# Patient Record
Sex: Female | Born: 1935 | Race: White | Hispanic: No | Marital: Married | Smoking: Never smoker
Health system: Southern US, Community
[De-identification: ages and names within clinical notes are randomized; demographics above are authoritative.]

## PROBLEM LIST (undated history)

## (undated) DIAGNOSIS — Z90710 Acquired absence of both cervix and uterus: Secondary | ICD-10-CM

## (undated) DIAGNOSIS — H269 Unspecified cataract: Secondary | ICD-10-CM

## (undated) DIAGNOSIS — G309 Alzheimer's disease, unspecified: Secondary | ICD-10-CM

## (undated) DIAGNOSIS — C801 Malignant (primary) neoplasm, unspecified: Secondary | ICD-10-CM

## (undated) DIAGNOSIS — M8580 Other specified disorders of bone density and structure, unspecified site: Secondary | ICD-10-CM

## (undated) DIAGNOSIS — L900 Lichen sclerosus et atrophicus: Secondary | ICD-10-CM

## (undated) DIAGNOSIS — Z9079 Acquired absence of other genital organ(s): Secondary | ICD-10-CM

## (undated) DIAGNOSIS — Z90722 Acquired absence of ovaries, bilateral: Secondary | ICD-10-CM

## (undated) DIAGNOSIS — M810 Age-related osteoporosis without current pathological fracture: Secondary | ICD-10-CM

## (undated) DIAGNOSIS — F0280 Dementia in other diseases classified elsewhere without behavioral disturbance: Secondary | ICD-10-CM

## (undated) HISTORY — DX: Unspecified cataract: H26.9

## (undated) HISTORY — DX: Other specified disorders of bone density and structure, unspecified site: M85.80

## (undated) HISTORY — DX: Acquired absence of ovaries, bilateral: Z90.722

## (undated) HISTORY — PX: SMALL INTESTINE SURGERY: SHX150

## (undated) HISTORY — DX: Lichen sclerosus et atrophicus: L90.0

## (undated) HISTORY — DX: Malignant (primary) neoplasm, unspecified: C80.1

## (undated) HISTORY — PX: EYE SURGERY: SHX253

## (undated) HISTORY — PX: HIP SURGERY: SHX245

## (undated) HISTORY — DX: Age-related osteoporosis without current pathological fracture: M81.0

## (undated) HISTORY — DX: Acquired absence of both cervix and uterus: Z90.710

## (undated) HISTORY — PX: ABDOMINAL HYSTERECTOMY: SHX81

## (undated) HISTORY — DX: Acquired absence of other genital organ(s): Z90.79

---

## 1997-11-19 ENCOUNTER — Other Ambulatory Visit: Admission: RE | Admit: 1997-11-19 | Discharge: 1997-11-19 | Payer: Self-pay | Admitting: Obstetrics and Gynecology

## 1999-07-01 ENCOUNTER — Other Ambulatory Visit: Admission: RE | Admit: 1999-07-01 | Discharge: 1999-07-01 | Payer: Self-pay | Admitting: Obstetrics and Gynecology

## 2000-05-25 ENCOUNTER — Encounter: Payer: Self-pay | Admitting: Specialist

## 2000-05-25 ENCOUNTER — Encounter: Admission: RE | Admit: 2000-05-25 | Discharge: 2000-05-25 | Payer: Self-pay | Admitting: Specialist

## 2000-06-15 ENCOUNTER — Encounter: Admission: RE | Admit: 2000-06-15 | Discharge: 2000-06-15 | Payer: Self-pay | Admitting: Orthopedic Surgery

## 2000-06-15 ENCOUNTER — Encounter: Payer: Self-pay | Admitting: Orthopedic Surgery

## 2000-07-16 ENCOUNTER — Other Ambulatory Visit: Admission: RE | Admit: 2000-07-16 | Discharge: 2000-07-16 | Payer: Self-pay | Admitting: Obstetrics and Gynecology

## 2001-07-20 ENCOUNTER — Other Ambulatory Visit: Admission: RE | Admit: 2001-07-20 | Discharge: 2001-07-20 | Payer: Self-pay | Admitting: Obstetrics and Gynecology

## 2002-08-01 ENCOUNTER — Other Ambulatory Visit: Admission: RE | Admit: 2002-08-01 | Discharge: 2002-08-01 | Payer: Self-pay | Admitting: Obstetrics and Gynecology

## 2004-08-06 ENCOUNTER — Other Ambulatory Visit: Admission: RE | Admit: 2004-08-06 | Discharge: 2004-08-06 | Payer: Self-pay | Admitting: Obstetrics and Gynecology

## 2005-09-17 ENCOUNTER — Other Ambulatory Visit: Admission: RE | Admit: 2005-09-17 | Discharge: 2005-09-17 | Payer: Self-pay | Admitting: Obstetrics and Gynecology

## 2007-02-21 ENCOUNTER — Encounter: Admission: RE | Admit: 2007-02-21 | Discharge: 2007-02-21 | Payer: Self-pay | Admitting: Obstetrics and Gynecology

## 2012-02-03 ENCOUNTER — Encounter: Payer: Self-pay | Admitting: Obstetrics and Gynecology

## 2012-02-03 ENCOUNTER — Ambulatory Visit (INDEPENDENT_AMBULATORY_CARE_PROVIDER_SITE_OTHER): Payer: Medicare Other | Admitting: Obstetrics and Gynecology

## 2012-02-03 VITALS — BP 130/62 | Ht 65.0 in | Wt 112.0 lb

## 2012-02-03 DIAGNOSIS — M8580 Other specified disorders of bone density and structure, unspecified site: Secondary | ICD-10-CM | POA: Insufficient documentation

## 2012-02-03 DIAGNOSIS — M810 Age-related osteoporosis without current pathological fracture: Secondary | ICD-10-CM | POA: Insufficient documentation

## 2012-02-03 DIAGNOSIS — Z9079 Acquired absence of other genital organ(s): Secondary | ICD-10-CM

## 2012-02-03 DIAGNOSIS — Z90710 Acquired absence of both cervix and uterus: Secondary | ICD-10-CM

## 2012-02-03 DIAGNOSIS — N952 Postmenopausal atrophic vaginitis: Secondary | ICD-10-CM

## 2012-02-03 DIAGNOSIS — C549 Malignant neoplasm of corpus uteri, unspecified: Secondary | ICD-10-CM

## 2012-02-03 DIAGNOSIS — C541 Malignant neoplasm of endometrium: Secondary | ICD-10-CM | POA: Insufficient documentation

## 2012-02-03 DIAGNOSIS — R238 Other skin changes: Secondary | ICD-10-CM

## 2012-02-03 DIAGNOSIS — M899 Disorder of bone, unspecified: Secondary | ICD-10-CM

## 2012-02-03 DIAGNOSIS — Z719 Counseling, unspecified: Secondary | ICD-10-CM

## 2012-02-03 DIAGNOSIS — L988 Other specified disorders of the skin and subcutaneous tissue: Secondary | ICD-10-CM

## 2012-02-03 MED ORDER — CLOBETASOL PROPIONATE 0.05 % EX OINT: TOPICAL_OINTMENT | CUTANEOUS | Status: DC

## 2012-02-03 MED ORDER — ESTROGENS, CONJUGATED 0.625 MG/GM VA CREA: TOPICAL_CREAM | Freq: Every day | VAGINAL | Status: DC

## 2012-02-03 NOTE — Progress Notes (Signed)
The patient is not taking hormone replacement therapy The patient  is taking a Calcium supplement. Post-menopausal bleeding:no  Last Pap: 2012 UNSATISFACTORY FOR EVALUATION Last mammogram: approximate date 02/02/2011 and was normal  Last DEXA scan : 01/2011 Last colonoscopy:approximate date 2012 and was normal  Urinary symptoms: states that urine is much darker than usual  Normal bowel movements: Yes Reports abuse at home: No:  Subjective:    Adriana Gray is a 78 y.o. female G2P2 who presents for annual exam.  The patient has no complaints today. The patient was unable to complete her bone density test because the facility stated it had not been 2 years.  Our records show the last test was done 02/2010.  The following portions of the patient's history were reviewed and updated as appropriate: allergies, current medications, past family history, past medical history, past social history, past surgical history and problem list.  Review of Systems Pertinent items are noted in HPI. Gastrointestinal:No change in bowel habits, no abdominal pain, no rectal bleeding Genitourinary:negative for dysuria, frequency, hematuria, nocturia and urinary incontinence    Objective:     BP 130/62  Ht 5\' 5"  (1.651 m)  Wt 112 lb (50.803 kg)  BMI 18.64 kg/m2  Weight:  Wt Readings from Last 1 Encounters:  02/03/12 112 lb (50.803 kg)     BMI: Body mass index is 18.64 kg/(m^2). General Appearance: Alert, appropriate appearance for age. No acute distress.  Right hand with several bullous lesions, some with opening of the lesions.  No bleeding. No induration, some with scaling. HEENT: Grossly normal Neck / Thyroid: Supple, no masses, nodes or enlargement Lungs: clear to auscultation bilaterally Back: No CVA tenderness Breast Exam: No masses or nodes.No dimpling, nipple retraction or discharge. Cardiovascular: Regular rate and rhythm. S1, S2, no murmur Gastrointestinal: Soft, non-tender, no masses or  organomegaly Pelvic Exam: External genitalia: atrophic with narrowing of the introitus Vaginal: atrophic mucosa, discharge, yellow and vaginal vault, well suspended Rectovaginal: normal rectal, no masses Lymphatic Exam: Non-palpable nodes in neck, clavicular, axillary, or inguinal regions Skin: no rash or abnormalities Neurologic: Normal gait and speech, no tremor  Psychiatric: Alert and oriented, appropriate affect.    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   New dermatologic lesions of right hand  Lichen sclerosus with some introital narrowing Plan:   Temovate once weekly Premarin vaginal cream 0.5 mg once weekly Pap smear in 3 months F/U DXA in 7/13 Keep appt for Dermatology followup

## 2012-02-10 ENCOUNTER — Telehealth: Payer: Self-pay | Admitting: Obstetrics and Gynecology

## 2012-02-10 NOTE — Telephone Encounter (Signed)
CHANDRA/epic °

## 2012-02-10 NOTE — Telephone Encounter (Signed)
Lm on vm for pt to cb per telephone call. 

## 2012-02-11 MED ORDER — ESTROGENS, CONJUGATED 0.625 MG/GM VA CREA: TOPICAL_CREAM | VAGINAL | Status: DC

## 2012-02-11 NOTE — Telephone Encounter (Signed)
On 02/10/12-Tc from pt. Wants to know if there is any alternative to Premarin cream due to inc cost. Spoke with vph, Estradiol is available. Pt prefers to stay with Premarin recs, therefore pt given information on different pharmacies to call to check on cost. Pt will call back if one is found and will call in new rx for Premarin to that pharm. Pt agrees.

## 2012-02-11 NOTE — Telephone Encounter (Signed)
On 02/11/12- Tc from pt. Pt has found a pharm([facility]) who has a lower cost available for Premarin cream. Rx called into pharm as requested. Pt voices understanding.

## 2012-02-29 ENCOUNTER — Telehealth: Payer: Self-pay | Admitting: Obstetrics and Gynecology

## 2012-02-29 NOTE — Telephone Encounter (Signed)
Triage/epic 

## 2012-02-29 NOTE — Telephone Encounter (Signed)
vph pt 

## 2012-03-01 NOTE — Telephone Encounter (Signed)
Tc to pt per telephone call. Pt wants vph to know that she had her DEXA scan and want to know her recs rgdg POC. Will consult with vph and cb with recs. Results of DEXA are in epic system. Pt voices understanding.

## 2012-03-28 ENCOUNTER — Encounter: Payer: Self-pay | Admitting: Obstetrics and Gynecology

## 2014-07-02 ENCOUNTER — Encounter: Payer: Self-pay | Admitting: Obstetrics and Gynecology

## 2014-11-06 ENCOUNTER — Ambulatory Visit (INDEPENDENT_AMBULATORY_CARE_PROVIDER_SITE_OTHER): Payer: Medicare Other | Admitting: Family Medicine

## 2014-11-06 VITALS — BP 150/88 | HR 73 | Temp 98.2°F | Resp 16 | Ht 62.5 in | Wt 121.0 lb

## 2014-11-06 DIAGNOSIS — I493 Ventricular premature depolarization: Secondary | ICD-10-CM

## 2014-11-06 DIAGNOSIS — Z8542 Personal history of malignant neoplasm of other parts of uterus: Secondary | ICD-10-CM | POA: Diagnosis not present

## 2014-11-06 DIAGNOSIS — F0390 Unspecified dementia without behavioral disturbance: Secondary | ICD-10-CM | POA: Diagnosis not present

## 2014-11-06 NOTE — Patient Instructions (Signed)
Take the forms to your adult care living facility  Return as needed

## 2014-11-06 NOTE — Progress Notes (Signed)
Subjective: Patient is here for the first time. She has been living in [place] and is currently staying up here with her daughter waiting entry into a assisted living facility with memory care. She does quite well. She is on digoxin for heart, and I'm uncertain exactly what the diagnosis was cause that. She has a history of osteoporosis. She generally does quite well. Review of systems is really unremarkable. Has had endometrial cancer in the past many years ago. She has had cataract surgery, one of which did well, the other did not. She's had a hysterectomy, small bowel blockage, and left femur fracture. She does not smoke, drink, or use drugs. She is not known to be allergic to any medications. She just wanted a checkup in the form filled out today. No major other complaints.  She has been independent and functional. She takes care of her own personal needs. She is still driving until fairly recently. She has been evaluated by a neurology clinic to [facility] and told that she had mild dementia, undiagnosed type  Objective: 81 year old lady alert. She is talkative and answers questions well significantly confused. She is not oriented to place or time. Her isn't memory seems good. She is a widow. Throat clear. TMs normal. Neck supple. No carotid bruits. Chest clear. Heart regular but has a moderate number of ectopy. Abdomen soft without mass or tenderness. No ankle edema. They denied urinary incontinence which she does have a little urine odor.  Assessment: Mild dementia (may be moderate at this point) History of endometrial cancer History of heart disease, undetermined type, with fairly frequent ectopy. History of small bowel obstruction remotely History of hip fracture History of cataract surgeries  Plan: She should do well in assisted living with memory care. Form completed. Return when necessary.

## 2014-11-07 ENCOUNTER — Telehealth: Payer: Self-pay

## 2014-11-07 NOTE — Telephone Encounter (Signed)
If the call is not returned on 11/07/14 call and ask for Mel Almond [phone]

## 2014-11-07 NOTE — Telephone Encounter (Signed)
Adriana Gray from [facility] is needing to talk with someone about the form dr hopper completed  Yesterday a few things needs to be changed  Best number [phone]

## 2014-11-08 NOTE — Telephone Encounter (Signed)
FL-2  section 11 assisted living Section 18 0.125 qd, identify if it is a tablet or capsule

## 2014-11-08 NOTE — Telephone Encounter (Signed)
Left message for pt to call back  °

## 2014-11-09 NOTE — Telephone Encounter (Signed)
Called and got fax # to send back to [facility] and gave to [place] to fax.

## 2014-11-12 ENCOUNTER — Telehealth: Payer: Self-pay

## 2014-11-12 NOTE — Telephone Encounter (Signed)
Johanna from [facility] assisted living called to check on the status of some forms we were supposed to fax to them. From previous phone messages it looks like mary sent this to them on 11/09/14, however [facility] said they never received anything from [place].  Please resend Fax #[phone]

## 2014-11-13 NOTE — Telephone Encounter (Signed)
I resent the form.

## 2015-01-17 ENCOUNTER — Telehealth: Payer: Self-pay | Admitting: Interventional Cardiology

## 2015-01-17 NOTE — Telephone Encounter (Signed)
Records rec from [facility] have to chart prep

## 2015-01-31 ENCOUNTER — Ambulatory Visit: Payer: Self-pay | Admitting: Interventional Cardiology

## 2015-02-07 ENCOUNTER — Encounter: Payer: Self-pay | Admitting: *Deleted

## 2015-02-10 DIAGNOSIS — I491 Atrial premature depolarization: Secondary | ICD-10-CM | POA: Insufficient documentation

## 2015-02-11 ENCOUNTER — Ambulatory Visit (INDEPENDENT_AMBULATORY_CARE_PROVIDER_SITE_OTHER): Payer: Medicare Other | Admitting: Interventional Cardiology

## 2015-02-11 ENCOUNTER — Encounter: Payer: Self-pay | Admitting: Interventional Cardiology

## 2015-02-11 VITALS — BP 120/68 | HR 67 | Ht 62.5 in | Wt 115.2 lb

## 2015-02-11 DIAGNOSIS — I491 Atrial premature depolarization: Secondary | ICD-10-CM | POA: Diagnosis not present

## 2015-02-11 DIAGNOSIS — R4189 Other symptoms and signs involving cognitive functions and awareness: Secondary | ICD-10-CM | POA: Diagnosis not present

## 2015-02-11 DIAGNOSIS — I10 Essential (primary) hypertension: Secondary | ICD-10-CM | POA: Diagnosis not present

## 2015-02-11 DIAGNOSIS — C641 Malignant neoplasm of right kidney, except renal pelvis: Secondary | ICD-10-CM | POA: Diagnosis not present

## 2015-02-11 DIAGNOSIS — C649 Malignant neoplasm of unspecified kidney, except renal pelvis: Secondary | ICD-10-CM | POA: Insufficient documentation

## 2015-02-11 MED ORDER — DIGOXIN 125 MCG PO TABS: 125.0000 ug | ORAL_TABLET | Freq: Every day | ORAL | Status: DC

## 2015-02-11 MED ORDER — DIGOXIN 125 MCG PO TABS: 125.0000 ug | ORAL_TABLET | Freq: Every day | ORAL | Status: AC

## 2015-02-11 NOTE — Patient Instructions (Signed)
Medication Instructions:  Your physician recommends that you continue on your current medications as directed. Please refer to the Current Medication list given to you today.   Labwork: NONE  Testing/Procedures: NONE  Follow-Up: Your physician wants you to follow-up in: 12 months with Dr. Tamala Julian. You will receive a reminder letter in the mail two months in advance. If you don't receive a letter, please call our office to schedule the follow-up appointment.   Any Other Special Instructions Will Be Listed Below (If Applicable).

## 2015-02-11 NOTE — Progress Notes (Signed)
Cardiology Office Note   Date:  02/11/2015   ID:  Adriana Gray, DOB 09-Nov-1927, MRN [id number]  PCP:  Reymundo Poll, MD  Cardiologist:  Sinclair Grooms, MD   Chief Complaint  Patient presents with  . New Evaluation    premature atrial contractions      History of Present Illness: Adriana Gray is a 81 y.o. female who presents for who presents for PACs. She is accustomed seen a cardiologist one see her. She has no cardiovascular complaints. She is apparently had PSVT in the past that has been treated with digoxin. She has been tried on his lowest 0.0625 mg daily but it did not control the arrhythmia. Her current regimen is 0.125 mg daily.  She denies anorexia, yellow halos, weight loss, syncope, dyspnea, but does have ankle edema that is chronic and unchanged.    Past Medical History  Diagnosis Date  . Adenocarcinoma   . Osteopenia   . S/P TAH-BSO   . Osteoporosis   . Lichen sclerosus   . Cataract     Past Surgical History  Procedure Laterality Date  . Hip surgery    . Abdominal hysterectomy    . Eye surgery    . Small intestine surgery       Current Outpatient Prescriptions  Medication Sig Dispense Refill  . digoxin (LANOXIN) 0.125 MG tablet Take 125 mcg by mouth daily.  0  . divalproex (DEPAKOTE) 125 MG DR tablet Take 125 mg by mouth 2 (two) times daily.    . Melatonin 3 MG CAPS Take 1 capsule by mouth at bedtime.    . memantine (NAMENDA) 5 MG tablet Take 5 mg by mouth 2 (two) times daily.    . prednisoLONE acetate (PRED FORTE) 1 % ophthalmic suspension Place 1 drop into the left eye at bedtime.  0  . QUEtiapine (SEROQUEL) 12.5 mg TABS tablet Take 12.5 mg by mouth at bedtime.    . Sulfamethoxazole-Trimethoprim (SEPTRA DS PO) Take 1 tablet by mouth 2 (two) times daily.     No current facility-administered medications for this visit.    Allergies:   Review of patient's allergies indicates no known allergies.    Social History:  The patient   reports that she has never smoked. She has never used smokeless tobacco. She reports that she does not drink alcohol or use illicit drugs.   Family History:  The patient's family history includes Cancer in her brother, father, and mother.    ROS:  Please see the history of present illness.   Otherwise, review of systems are positive for cognitive impairment.   All other systems are reviewed and negative.    PHYSICAL EXAM: VS:  BP 120/68 mmHg  Pulse 67  Ht 5' 2.5" (1.588 m)  Wt 52.254 kg (115 lb 3.2 oz)  BMI 20.72 kg/m2 , BMI Body mass index is 20.72 kg/(m^2). GEN: Well nourished, well developed, in no acute distress HEENT: normal Neck: no JVD, carotid bruits, or masses Cardiac: RRR; no murmurs, rubs, or gallops. 2+ LE pedal edema.  Respiratory:  clear to auscultation bilaterally, normal work of breathing GI: soft, nontender, nondistended, + BS MS: no deformity or atrophy Skin: warm and dry, no rash Neuro:  Strength and sensation are intact Psych: euthymic mood, full affect   EKG:  EKG is ordered today. The ekg ordered today demonstrates NSR with NSTWA c/w dig effect unchanged from prior   Recent Labs: No results found for requested labs  within last 365 days.    Lipid Panel No results found for: CHOL, TRIG, HDL, CHOLHDL, VLDL, LDLCALC, LDLDIRECT    Wt Readings from Last 3 Encounters:  02/11/15 52.254 kg (115 lb 3.2 oz)  11/06/14 54.885 kg (121 lb)  02/03/12 50.803 kg (112 lb)      Other studies Reviewed: Additional studies/ records that were reviewed today include: Reviewed records from [facility], Dr. Benjie Karvonen. Review of the above records demonstrates: Medication list is updated   ASSESSMENT AND PLAN:  PAC (premature atrial contraction) -history of PACs without prior atrial fibrillation according to the patient's daughter and records.   Essential hypertension -currently on no therapy and under good control   Renal cell carcinoma, right -  stable  Potentially toxic medication, digoxin Cognitive impairment     Current medicines are reviewed at length with the patient today.  The patient does not have concerns regarding medicines.  The following changes have been made:  Atenolol is not currently been taken by the patient. Her digoxin dose is 0.125 mg daily. We will assume responsibility for refilling digoxin. We discussed clinical features of digoxin toxicity including anorexia and yellow halos around lights.  Labs/ tests ordered today include:   No orders of the defined types were placed in this encounter.     Disposition:   FU with HS in 1 year  Signed, Sinclair Grooms, MD  02/11/2015 10:02 AM    [facility], [place], [place]  [address] Phone: [phone]; Fax: [phone]

## 2015-04-07 ENCOUNTER — Emergency Department (HOSPITAL_COMMUNITY)
Admission: EM | Admit: 2015-04-07 | Discharge: 2015-04-07 | Disposition: A | Discharge status: Home / Self Care | Payer: Medicare Other | Attending: Emergency Medicine | Admitting: Emergency Medicine

## 2015-04-07 ENCOUNTER — Encounter (HOSPITAL_COMMUNITY): Payer: Self-pay | Admitting: Emergency Medicine

## 2015-04-07 ENCOUNTER — Emergency Department (HOSPITAL_COMMUNITY): Payer: Medicare Other

## 2015-04-07 DIAGNOSIS — Y92129 Unspecified place in nursing home as the place of occurrence of the external cause: Secondary | ICD-10-CM | POA: Diagnosis not present

## 2015-04-07 DIAGNOSIS — Y998 Other external cause status: Secondary | ICD-10-CM | POA: Diagnosis not present

## 2015-04-07 DIAGNOSIS — Z90710 Acquired absence of both cervix and uterus: Secondary | ICD-10-CM | POA: Diagnosis not present

## 2015-04-07 DIAGNOSIS — W1839XA Other fall on same level, initial encounter: Secondary | ICD-10-CM | POA: Insufficient documentation

## 2015-04-07 DIAGNOSIS — Z79899 Other long term (current) drug therapy: Secondary | ICD-10-CM | POA: Insufficient documentation

## 2015-04-07 DIAGNOSIS — F0390 Unspecified dementia without behavioral disturbance: Secondary | ICD-10-CM | POA: Insufficient documentation

## 2015-04-07 DIAGNOSIS — Z7952 Long term (current) use of systemic steroids: Secondary | ICD-10-CM | POA: Diagnosis not present

## 2015-04-07 DIAGNOSIS — Z8739 Personal history of other diseases of the musculoskeletal system and connective tissue: Secondary | ICD-10-CM | POA: Diagnosis not present

## 2015-04-07 DIAGNOSIS — Z872 Personal history of diseases of the skin and subcutaneous tissue: Secondary | ICD-10-CM | POA: Insufficient documentation

## 2015-04-07 DIAGNOSIS — Z8669 Personal history of other diseases of the nervous system and sense organs: Secondary | ICD-10-CM | POA: Insufficient documentation

## 2015-04-07 DIAGNOSIS — S6991XA Unspecified injury of right wrist, hand and finger(s), initial encounter: Secondary | ICD-10-CM | POA: Diagnosis present

## 2015-04-07 DIAGNOSIS — Y9389 Activity, other specified: Secondary | ICD-10-CM | POA: Diagnosis not present

## 2015-04-07 DIAGNOSIS — Z859 Personal history of malignant neoplasm, unspecified: Secondary | ICD-10-CM | POA: Insufficient documentation

## 2015-04-07 NOTE — Discharge Instructions (Signed)
-   OTC tylenol for pain control as needed - Ice for 15-20 minutes every 2-3 hours for pain as needed - Return to ED if pain increases, swelling rapidly increases, changes of color to the thumb, loss of sensation to the thumb or symptoms further worsen

## 2015-04-07 NOTE — ED Provider Notes (Signed)
Pt seen and evaluated.  C/O right thumb pain.  FROM on exam without pain.  Arthritic on XR s acute changes.  Tanna Furry, MD 04/07/15 [phone]

## 2015-04-07 NOTE — ED Notes (Signed)
Pt from [facility] senior living. Per EMS pt combative at facility, fell, c/o right thumb pain, swelling to same. Hx dementia.

## 2015-04-07 NOTE — ED Notes (Signed)
Bed: WA21 Expected date:  Expected time:  Means of arrival:  Comments: EMS 81 yr old thumb injury

## 2015-04-07 NOTE — ED Provider Notes (Signed)
CSN: [id number]     Arrival date & time 04/07/15  2005 History   First MD Initiated Contact with Patient 04/07/15 2015     Chief Complaint  Patient presents with  . Extremity Pain  . Fall    HPI  Adriana Gray is an 81 year old female with history of dementia presenting after a fall at her nursing home. A nurse aide is present with her to help provide history. Adriana Gray says she fell and caught herself with her arms outstretched. Her right thumb is swollen but she is not complaining of any pain. She says that it feels a "little tingly" on the right thumb tip. She can move the thumb without pain and has full range of motion. She reports no other injuries. The nurse's aide states that she was being combative during bed time at the nursing home and fell while trying to get to the door. She reports Adriana Gray is at baseline behavior and mental status. Adriana Gray is easily distracted during the interview and is not oriented to time or place. Denies headache, dizziness, chest pain, abdominal pain or vomiting. The nurse's aide denies recent fever, vomiting, diarrhea, changes in urinary frequency or changes in mental status.   Past Medical History  Diagnosis Date  . Adenocarcinoma   . Osteopenia   . S/P TAH-BSO   . Osteoporosis   . Lichen sclerosus   . Cataract    Past Surgical History  Procedure Laterality Date  . Hip surgery    . Abdominal hysterectomy    . Eye surgery    . Small intestine surgery     Family History  Problem Relation Age of Onset  . Cancer Mother   . Cancer Father   . Cancer Brother    History  Substance Use Topics  . Smoking status: Never Smoker   . Smokeless tobacco: Never Used  . Alcohol Use: No   OB History    Gravida Para Term Preterm AB TAB SAB Ectopic Multiple Living   2 2        2      Review of Systems  Constitutional: Negative for fever.  Cardiovascular: Negative for chest pain.  Gastrointestinal: Negative for abdominal pain.  Musculoskeletal:  Positive for joint swelling.  Skin: Negative for wound.  Neurological: Positive for numbness (thumb). Negative for dizziness, weakness, light-headedness and headaches.      Allergies  Review of patient's allergies indicates no known allergies.  Home Medications   Prior to Admission medications   Medication Sig Start Date End Date Taking? Authorizing Provider  acetaminophen (TYLENOL) 325 MG tablet Take 650 mg by mouth 2 (two) times daily as needed for fever.   Yes Historical Provider, MD  ARIPiprazole (ABILIFY) 5 MG tablet Take 5 mg by mouth daily.   Yes Historical Provider, MD  Dextromethorphan-Quinidine (NUEDEXTA) 20-10 MG CAPS Take 1 capsule by mouth 2 (two) times daily.   Yes Historical Provider, MD  Difluprednate (DUREZOL) 0.05 % EMUL Place 1 drop into the left eye 4 (four) times daily.   Yes Historical Provider, MD  digoxin (LANOXIN) 0.125 MG tablet Take 1 tablet (125 mcg total) by mouth daily. 02/11/15  Yes Belva Crome, MD  divalproex (DEPAKOTE) 125 MG DR tablet Take 125 mg by mouth 2 (two) times daily. Take along with 250 mg tablet.   Yes Historical Provider, MD  divalproex (DEPAKOTE) 250 MG DR tablet Take 250 mg by mouth 2 (two) times daily. Take along with 125 mg  tablet.   Yes Historical Provider, MD  LORazepam (ATIVAN) 0.5 MG tablet Take 0.25 mg by mouth daily as needed for anxiety.   Yes Historical Provider, MD  Loteprednol Etabonate (LOTEMAX) 0.5 % OINT Place 1 application into the left eye at bedtime.   Yes Historical Provider, MD  Melatonin 3 MG CAPS Take 1 capsule by mouth at bedtime.   Yes Historical Provider, MD  memantine (NAMENDA) 5 MG tablet Take 2.5 mg by mouth 2 (two) times daily.    Yes Historical Provider, MD  metoprolol succinate (TOPROL-XL) 25 MG 24 hr tablet Take 12.5 mg by mouth daily.   Yes Historical Provider, MD  ondansetron (ZOFRAN) 8 MG tablet Take 8 mg by mouth every 8 (eight) hours as needed for nausea or vomiting.   Yes Historical Provider, MD  sodium  chloride (MURO 128) 2 % ophthalmic solution Place 1 drop into the left eye 4 (four) times daily.   Yes Historical Provider, MD   BP 147/74 mmHg  Pulse 64  Temp(Src) 99 F (37.2 C) (Oral)  Resp 18  SpO2 94% Physical Exam  Constitutional: She appears well-developed and well-nourished. No distress.  HENT:  Head: Normocephalic and atraumatic.  Musculoskeletal:       Right hand: She exhibits swelling. She exhibits normal range of motion, no tenderness, no bony tenderness and no laceration.       Hands: Full ROM of b/l wrists, elbows, shoulders and knees. No obvious deformity of other extremities. No wounds visible.   Neurological: She is alert. She has normal strength. No sensory deficit.  Skin: Skin is warm and dry.  No wounds to head, upper or lower extremities  Psychiatric: Cognition and memory are impaired.  Patient easily distracted. Not oriented to place or time.    ED Course  Procedures (including critical care time) Labs Review Labs Reviewed - No data to display  Imaging Review Dg Hand Complete Right  04/07/2015   CLINICAL DATA:  Fall with thumb injury.  Initial encounter.  EXAM: RIGHT HAND - COMPLETE 3+ VIEW  COMPARISON:  None.  FINDINGS: No evidence of acute fracture or dislocation.  There is multi focal advanced osteoarthritis with joint narrowing and spurring, most progressed at the thumb interphalangeal and second through fourth DIP joints.  IMPRESSION: No acute finding.   Electronically Signed   By: Monte Fantasia M.D.   On: 04/07/2015 21:47     EKG Interpretation None      MDM   Final diagnoses:  Thumb injury, right, initial encounter    1. Right thumb injury - Right hand xray shows OA of thumb but no acute injury - Ice thumb until pain resolves - Continue all home medications - OTC tylenol PRN for pain relief - Daughter arrived to ED to take patient back to nursing facility  - Return to ED with worsening thumb pain, changing color of thumb, loss of  sensation of thumb or further worsening symptoms    Josephina Gip, PA-C 04/07/15 2311  Tanna Furry, MD 04/17/15 [phone]

## 2015-07-30 ENCOUNTER — Emergency Department (HOSPITAL_COMMUNITY)
Admission: EM | Admit: 2015-07-30 | Discharge: 2015-07-31 | Disposition: A | Discharge status: Home / Self Care | Payer: Medicare Other | Attending: Emergency Medicine | Admitting: Emergency Medicine

## 2015-07-30 ENCOUNTER — Encounter (HOSPITAL_COMMUNITY): Payer: Self-pay

## 2015-07-30 ENCOUNTER — Emergency Department (HOSPITAL_COMMUNITY): Payer: Medicare Other

## 2015-07-30 DIAGNOSIS — Y92128 Other place in nursing home as the place of occurrence of the external cause: Secondary | ICD-10-CM | POA: Diagnosis not present

## 2015-07-30 DIAGNOSIS — Z79899 Other long term (current) drug therapy: Secondary | ICD-10-CM | POA: Insufficient documentation

## 2015-07-30 DIAGNOSIS — Z872 Personal history of diseases of the skin and subcutaneous tissue: Secondary | ICD-10-CM | POA: Diagnosis not present

## 2015-07-30 DIAGNOSIS — S7001XA Contusion of right hip, initial encounter: Secondary | ICD-10-CM | POA: Diagnosis not present

## 2015-07-30 DIAGNOSIS — Z8589 Personal history of malignant neoplasm of other organs and systems: Secondary | ICD-10-CM | POA: Diagnosis not present

## 2015-07-30 DIAGNOSIS — Y998 Other external cause status: Secondary | ICD-10-CM | POA: Diagnosis not present

## 2015-07-30 DIAGNOSIS — S3992XA Unspecified injury of lower back, initial encounter: Secondary | ICD-10-CM | POA: Insufficient documentation

## 2015-07-30 DIAGNOSIS — H269 Unspecified cataract: Secondary | ICD-10-CM | POA: Diagnosis not present

## 2015-07-30 DIAGNOSIS — Z90710 Acquired absence of both cervix and uterus: Secondary | ICD-10-CM | POA: Diagnosis not present

## 2015-07-30 DIAGNOSIS — Y9389 Activity, other specified: Secondary | ICD-10-CM | POA: Diagnosis not present

## 2015-07-30 DIAGNOSIS — W1839XA Other fall on same level, initial encounter: Secondary | ICD-10-CM | POA: Insufficient documentation

## 2015-07-30 DIAGNOSIS — F0390 Unspecified dementia without behavioral disturbance: Secondary | ICD-10-CM | POA: Diagnosis not present

## 2015-07-30 DIAGNOSIS — Z90722 Acquired absence of ovaries, bilateral: Secondary | ICD-10-CM | POA: Diagnosis not present

## 2015-07-30 DIAGNOSIS — W19XXXA Unspecified fall, initial encounter: Secondary | ICD-10-CM

## 2015-07-30 DIAGNOSIS — S79911A Unspecified injury of right hip, initial encounter: Secondary | ICD-10-CM | POA: Diagnosis present

## 2015-07-30 LAB — BASIC METABOLIC PANEL
Anion gap: 7 (ref 5–15)
BUN: 26 mg/dL — AB (ref 6–20)
CALCIUM: 8.8 mg/dL — AB (ref 8.9–10.3)
CO2: 30 mmol/L (ref 22–32)
CREATININE: 1.07 mg/dL — AB (ref 0.44–1.00)
Chloride: 102 mmol/L (ref 101–111)
GFR calc Af Amer: 53 mL/min — ABNORMAL LOW (ref >60)
GFR, EST NON AFRICAN AMERICAN: 46 mL/min — AB (ref >60)
GLUCOSE: 100 mg/dL — AB (ref 65–99)
Potassium: 4.4 mmol/L (ref 3.5–5.1)
Sodium: 139 mmol/L (ref 135–145)

## 2015-07-30 LAB — URINALYSIS, ROUTINE W REFLEX MICROSCOPIC
BILIRUBIN URINE: NEGATIVE
Glucose, UA: NEGATIVE mg/dL
KETONES UR: NEGATIVE mg/dL
Leukocytes, UA: NEGATIVE
NITRITE: NEGATIVE
Protein, ur: NEGATIVE mg/dL
Specific Gravity, Urine: 1.017 (ref 1.005–1.030)
pH: 7 (ref 5.0–8.0)

## 2015-07-30 LAB — CBC
HCT: 32.3 % — ABNORMAL LOW (ref 36.0–46.0)
Hemoglobin: 10.6 g/dL — ABNORMAL LOW (ref 12.0–15.0)
MCH: 33.4 pg (ref 26.0–34.0)
MCHC: 32.8 g/dL (ref 30.0–36.0)
MCV: 101.9 fL — ABNORMAL HIGH (ref 78.0–100.0)
PLATELETS: 174 10*3/uL (ref 150–400)
RBC: 3.17 MIL/uL — ABNORMAL LOW (ref 3.87–5.11)
RDW: 13.8 % (ref 11.5–15.5)
WBC: 7.1 10*3/uL (ref 4.0–10.5)

## 2015-07-30 LAB — URINE MICROSCOPIC-ADD ON: Bacteria, UA: NONE SEEN

## 2015-07-30 NOTE — ED Provider Notes (Signed)
CSN: [id number]     Arrival date & time 07/30/15  2050 History   First MD Initiated Contact with Patient 07/30/15 2057     Chief Complaint  Patient presents with  . Fall  . Hip Pain  . Back Pain  . Dementia     (Consider location/radiation/quality/duration/timing/severity/associated sxs/prior Treatment) HPI Comments: The patient is an 81 year old female who arrives from her nursing facility by ambulance, she has dementia, the staff came to her room and found her on the floor, she was complaining of right hip pain, there is no deformities, she has had increasing confusion over the past couple of weeks but has otherwise been at baseline. The patient on my exam has no complaints, she states that she was in her usual state of health, she states that today she was at work and became nauseated, she had to go home but got better and went back to work. The patient does not work, she is in a skilled nursing facility. Otherwise she is pleasant and answers my questions happily  Patient is a 81 y.o. female presenting with fall, hip pain, and back pain. The history is provided by the patient.  Fall  Hip Pain  Back Pain   Past Medical History  Diagnosis Date  . Adenocarcinoma ([place])   . Osteopenia   . S/P TAH-BSO   . Osteoporosis   . Lichen sclerosus   . Cataract    Past Surgical History  Procedure Laterality Date  . Hip surgery    . Abdominal hysterectomy    . Eye surgery    . Small intestine surgery     Family History  Problem Relation Age of Onset  . Cancer Mother   . Cancer Father   . Cancer Brother    Social History  Substance Use Topics  . Smoking status: Never Smoker   . Smokeless tobacco: Never Used  . Alcohol Use: No   OB History    Gravida Para Term Preterm AB TAB SAB Ectopic Multiple Living   2 2        2      Review of Systems  Unable to perform ROS: Dementia  Musculoskeletal: Positive for back pain.      Allergies  Review of patient's allergies indicates  no known allergies.  Home Medications   Prior to Admission medications   Medication Sig Start Date End Date Taking? Authorizing Provider  acetaminophen (TYLENOL) 325 MG tablet Take 650 mg by mouth 2 (two) times daily as needed for fever.   Yes Historical Provider, MD  ARIPiprazole (ABILIFY) 5 MG tablet Take 5 mg by mouth daily.   Yes Historical Provider, MD  Dextromethorphan-Quinidine (NUEDEXTA) 20-10 MG CAPS Take 1 capsule by mouth 2 (two) times daily.   Yes Historical Provider, MD  Difluprednate (DUREZOL) 0.05 % EMUL Place 1 drop into the left eye 4 (four) times daily.   Yes Historical Provider, MD  digoxin (LANOXIN) 0.125 MG tablet Take 1 tablet (125 mcg total) by mouth daily. 02/11/15  Yes Belva Crome, MD  divalproex (DEPAKOTE) 250 MG DR tablet Take 250 mg by mouth 2 (two) times daily.    Yes Historical Provider, MD  ENSURE (ENSURE) Take 237 mLs by mouth daily.   Yes Historical Provider, MD  LORazepam (ATIVAN) 0.5 MG tablet Take 0.25 mg by mouth daily as needed for anxiety.   Yes Historical Provider, MD  Melatonin 3 MG CAPS Take 1 capsule by mouth at bedtime.   Yes Historical Provider, MD  memantine (NAMENDA) 5 MG tablet Take 2.5 mg by mouth 2 (two) times daily.    Yes Historical Provider, MD  metoprolol succinate (TOPROL-XL) 25 MG 24 hr tablet Take 12.5 mg by mouth daily.   Yes Historical Provider, MD  ondansetron (ZOFRAN) 8 MG tablet Take 8 mg by mouth every 8 (eight) hours as needed for nausea or vomiting.   Yes Historical Provider, MD  sodium chloride (MURO 128) 2 % ophthalmic solution Place 1 drop into the left eye 4 (four) times daily.   Yes Historical Provider, MD   BP 164/68 mmHg  Pulse 66  Temp(Src) 98.6 F (37 C) (Oral)  Resp 18  SpO2 99% Physical Exam  Constitutional: She appears well-developed and well-nourished. No distress.  HENT:  Head: Normocephalic and atraumatic.  Mouth/Throat: Oropharynx is clear and moist. No oropharyngeal exudate.  Eyes: Conjunctivae and EOM  are normal. Pupils are equal, round, and reactive to light. Right eye exhibits no discharge. Left eye exhibits no discharge. No scleral icterus.  Neck: Normal range of motion. Neck supple. No JVD present. No thyromegaly present.  Cardiovascular: Normal rate, regular rhythm, normal heart sounds and intact distal pulses.  Exam reveals no gallop and no friction rub.   No murmur heard. Pulmonary/Chest: Effort normal and breath sounds normal. No respiratory distress. She has no wheezes. She has no rales.  Abdominal: Soft. Bowel sounds are normal. She exhibits no distension and no mass. There is no tenderness.  Musculoskeletal: Normal range of motion. She exhibits tenderness ( Minimal tenderness over the right hip, normal range of motion, no leg length discrepancies). She exhibits no edema.  Lymphadenopathy:    She has no cervical adenopathy.  Neurological: She is alert. Coordination normal.  Follows commands, memory loss to the events of the day, the patient is disoriented but happy and nose are name, normal carbonation, normal strength in all 4 extremities, able to straight leg raise  Skin: Skin is warm and dry. No rash noted. No erythema.  Psychiatric: She has a normal mood and affect. Her behavior is normal.  Nursing note and vitals reviewed.   ED Course  Procedures (including critical care time) Labs Review Labs Reviewed  BASIC METABOLIC PANEL - Abnormal; Notable for the following:    Glucose, Bld 100 (*)    BUN 26 (*)    Creatinine, Ser 1.07 (*)    Calcium 8.8 (*)    GFR calc non Af Amer 46 (*)    GFR calc Af Amer 53 (*)    All other components within normal limits  CBC - Abnormal; Notable for the following:    RBC 3.17 (*)    Hemoglobin 10.6 (*)    HCT 32.3 (*)    MCV 101.9 (*)    All other components within normal limits  URINALYSIS, ROUTINE W REFLEX MICROSCOPIC (NOT AT [facility]) - Abnormal; Notable for the following:    Hgb urine dipstick SMALL (*)    All other components within  normal limits  URINE MICROSCOPIC-ADD ON - Abnormal; Notable for the following:    Squamous Epithelial / LPF 0-5 (*)    All other components within normal limits    Imaging Review Dg Hip Unilat With Pelvis 2-3 Views Right  07/30/2015  CLINICAL DATA:  Found on floor.  Presumed unwitnessed fall. EXAM: DG HIP (WITH OR WITHOUT PELVIS) 2-3V RIGHT COMPARISON:  None. FINDINGS: There is no evidence of hip fracture or dislocation. There is no evidence of arthropathy or other focal bone abnormality. IMPRESSION:  Negative. Electronically Signed   By: Andreas Newport M.D.   On: 07/30/2015 21:58   I have personally reviewed and evaluated these images and lab results as part of my medical decision-making.   EKG Interpretation None      MDM   Final diagnoses:  Fall, initial encounter  Contusion, hip, right, initial encounter    No signs of deformity suggesting fracture however she has had a fall and complains of right hip pain. X-rays ordered, because she is having some increased confusion according to the skilled nursing facility we'll obtain baseline labs.  Labs normal, patient well-appearing, states she wants to go home   Noemi Chapel, MD 07/30/15 2304

## 2015-07-30 NOTE — ED Notes (Signed)
Bed: [id number] Expected date:  Expected time:  Means of arrival:  Comments: EMS 81 yo from SNF/fall, dementia, hip and lower back pain/frequent falls

## 2015-07-30 NOTE — ED Notes (Signed)
Patient arrives from [place] by EMS.  Per EMS, patient has dementia, staff at SNF came in to patient's room and found her supine on the floor, complaining right hip pain and lower back pain.  No deformities per EMS.  Increasing confusion and falls over the past 2 weeks.  Physician at [facility] requesting urinalysis for patient.

## 2015-07-30 NOTE — ED Notes (Signed)
Pt got out of her bed unassisted and walked to the bathroom

## 2015-07-30 NOTE — ED Notes (Signed)
Patient is ambulatory out of room with no assist, requesting to leave.  Assisted to geri-chair and placed at Nurse's station

## 2015-11-09 ENCOUNTER — Encounter (HOSPITAL_COMMUNITY): Payer: Self-pay | Admitting: Nurse Practitioner

## 2015-11-09 ENCOUNTER — Emergency Department (HOSPITAL_COMMUNITY): Payer: Medicare Other

## 2015-11-09 ENCOUNTER — Emergency Department (HOSPITAL_COMMUNITY)
Admission: EM | Admit: 2015-11-09 | Discharge: 2015-11-09 | Disposition: A | Discharge status: Home / Self Care | Payer: Medicare Other | Attending: Emergency Medicine | Admitting: Emergency Medicine

## 2015-11-09 DIAGNOSIS — Y92129 Unspecified place in nursing home as the place of occurrence of the external cause: Secondary | ICD-10-CM | POA: Insufficient documentation

## 2015-11-09 DIAGNOSIS — Z79899 Other long term (current) drug therapy: Secondary | ICD-10-CM | POA: Diagnosis not present

## 2015-11-09 DIAGNOSIS — Z859 Personal history of malignant neoplasm, unspecified: Secondary | ICD-10-CM | POA: Diagnosis not present

## 2015-11-09 DIAGNOSIS — Z8739 Personal history of other diseases of the musculoskeletal system and connective tissue: Secondary | ICD-10-CM | POA: Diagnosis not present

## 2015-11-09 DIAGNOSIS — F0390 Unspecified dementia without behavioral disturbance: Secondary | ICD-10-CM | POA: Diagnosis not present

## 2015-11-09 DIAGNOSIS — Z043 Encounter for examination and observation following other accident: Secondary | ICD-10-CM | POA: Diagnosis not present

## 2015-11-09 DIAGNOSIS — Z872 Personal history of diseases of the skin and subcutaneous tissue: Secondary | ICD-10-CM | POA: Insufficient documentation

## 2015-11-09 DIAGNOSIS — W1839XA Other fall on same level, initial encounter: Secondary | ICD-10-CM | POA: Diagnosis not present

## 2015-11-09 DIAGNOSIS — Y9389 Activity, other specified: Secondary | ICD-10-CM | POA: Diagnosis not present

## 2015-11-09 DIAGNOSIS — Z8669 Personal history of other diseases of the nervous system and sense organs: Secondary | ICD-10-CM | POA: Insufficient documentation

## 2015-11-09 DIAGNOSIS — Y998 Other external cause status: Secondary | ICD-10-CM | POA: Insufficient documentation

## 2015-11-09 DIAGNOSIS — W19XXXA Unspecified fall, initial encounter: Secondary | ICD-10-CM

## 2015-11-09 NOTE — ED Notes (Signed)
Bed: WA06 Expected date:  Expected time:  Means of arrival:  Comments: EMS elderly fall 

## 2015-11-09 NOTE — ED Provider Notes (Signed)
Medical screening examination/treatment/procedure(s) were conducted as a shared visit with non-physician practitioner(s) and myself.  I personally evaluated the patient during the encounter.   EKG Interpretation None     Patient here after unwitnessed fall at nursing home. Head CT and neck CT without acute findings. According to her relative, she is at her baseline. Stable for discharge  Lacretia Leigh, MD 11/09/15 1000

## 2015-11-09 NOTE — Discharge Instructions (Signed)
There Does not appear to be an emergent cause for your fall. Your CT scan of your head and neck were negative. It is important to follow up with your doctor in the next 1-2 days for reevaluation. Return to ED for any worsening symptoms as we discussed.

## 2015-11-09 NOTE — ED Notes (Signed)
Patient's daughter at bedside, verbalizes understanding of discharge teaching and states she is able to transport Adriana Gray back to her memory care center.

## 2015-11-09 NOTE — ED Provider Notes (Signed)
CSN: [id number]     Arrival date & time 11/09/15  0756 History   First MD Initiated Contact with Patient 11/09/15 [phone]     Chief Complaint  Patient presents with  . Fall   Level V caveat: Dementia  (Consider location/radiation/quality/duration/timing/severity/associated sxs/prior Treatment) HPI Adriana Gray is a 82 y.o. female Presents to emergency department via EMS from [facility] for evaluation of unwitnessed fall. Patient has history of dementia and is at baseline per nursing and facility. She does maintain a left eye droop that is also baseline according to staff at facility. Patient denies any discomfort at this time. Does not remember any factors surrounding her fall.Patient does not take anticoagulation.  Past Medical History  Diagnosis Date  . Adenocarcinoma ([place])   . Osteopenia   . S/P TAH-BSO   . Osteoporosis   . Lichen sclerosus   . Cataract    Past Surgical History  Procedure Laterality Date  . Hip surgery    . Abdominal hysterectomy    . Eye surgery    . Small intestine surgery     Family History  Problem Relation Age of Onset  . Cancer Mother   . Cancer Father   . Cancer Brother    Social History  Substance Use Topics  . Smoking status: Never Smoker   . Smokeless tobacco: Never Used  . Alcohol Use: No   OB History    Gravida Para Term Preterm AB TAB SAB Ectopic Multiple Living   2 2        2      Review of Systems  Unable to perform ROS: Dementia       Allergies  Review of patient's allergies indicates no known allergies.  Home Medications   Prior to Admission medications   Medication Sig Start Date End Date Taking? Authorizing Provider  acetaminophen (TYLENOL) 325 MG tablet Take 325 mg by mouth 2 (two) times daily as needed for moderate pain or fever.    Yes Historical Provider, MD  ARIPiprazole (ABILIFY) 2 MG tablet Take 4 mg by mouth daily.  09/19/15  Yes Historical Provider, MD  Dextromethorphan-Quinidine (NUEDEXTA)  20-10 MG CAPS Take 1 capsule by mouth 2 (two) times daily.   Yes Historical Provider, MD  Difluprednate (DUREZOL) 0.05 % EMUL Place 1 drop into the left eye 4 (four) times daily.   Yes Historical Provider, MD  digoxin (LANOXIN) 0.125 MG tablet Take 1 tablet (125 mcg total) by mouth daily. 02/11/15  Yes Belva Crome, MD  divalproex (DEPAKOTE ER) 250 MG 24 hr tablet Take 250 mg by mouth at bedtime.  10/12/15  Yes Historical Provider, MD  divalproex (DEPAKOTE SPRINKLE) 125 MG capsule Take 125 mg by mouth daily.  11/03/15  Yes Historical Provider, MD  ENSURE (ENSURE) Take 237 mLs by mouth daily. 1 can daily between meals   Yes Historical Provider, MD  LORazepam (ATIVAN) 0.5 MG tablet Take 0.25 mg by mouth daily as needed for anxiety.   Yes Historical Provider, MD  Melatonin 3 MG TABS Take 3 mg by mouth at bedtime.    Yes Historical Provider, MD  memantine (NAMENDA) 5 MG tablet Take 2.5 mg by mouth at bedtime.    Yes Historical Provider, MD  metoprolol succinate (TOPROL-XL) 25 MG 24 hr tablet Take 12.5 mg by mouth daily.   Yes Historical Provider, MD  ondansetron (ZOFRAN) 8 MG tablet Take 8 mg by mouth every 8 (eight) hours as needed for nausea or vomiting.  Yes Historical Provider, MD  sodium chloride (MURO 128) 2 % ophthalmic solution Place 1 drop into the left eye 4 (four) times daily.   Yes Historical Provider, MD   BP 144/78 mmHg  Pulse 65  Temp(Src) 98 F (36.7 C) (Oral)  Resp 14  SpO2 94% Physical Exam  Constitutional: She is oriented to person, place, and time. She appears well-developed and well-nourished.  HENT:  Head: Normocephalic and atraumatic.  Mouth/Throat: Oropharynx is clear and moist.  No hemotympanum. No battle sign or raccoon eyes.  Eyes: Conjunctivae are normal. Pupils are equal, round, and reactive to light. Right eye exhibits no discharge. Left eye exhibits no discharge. No scleral icterus.  Neck: Normal range of motion. Neck supple.  Cardiovascular: Normal rate, regular  rhythm and normal heart sounds.   Pulmonary/Chest: Effort normal and breath sounds normal. No respiratory distress. She has no wheezes. She has no rales.  Abdominal: Soft. There is no tenderness.  Musculoskeletal: Normal range of motion. She exhibits no tenderness.  Full active range of motion. No deformities noted. No other abnormalities.  Neurological: She is alert and oriented to person, place, and time.  Cranial Nerves II-XII grossly intact  Skin: Skin is warm and dry. No rash noted.  Psychiatric: She has a normal mood and affect.  Nursing note and vitals reviewed.   ED Course  Procedures (including critical care time) Labs Review Labs Reviewed - No data to display  Imaging Review Ct Head Wo Contrast  11/09/2015  CLINICAL DATA:  On witnessed fall.  Chronic left eyelid droop. EXAM: CT HEAD WITHOUT CONTRAST CT CERVICAL SPINE WITHOUT CONTRAST TECHNIQUE: Multidetector CT imaging of the head and cervical spine was performed following the standard protocol without intravenous contrast. Multiplanar CT image reconstructions of the cervical spine were also generated. COMPARISON:  None. FINDINGS: CT HEAD FINDINGS Moderate generalized atrophy and advanced diffuse white matter disease is evident bilaterally. No acute cortical infarct, hemorrhage, or mass lesion is present. The basal ganglia are grossly intact. Insular ribbon is intact bilaterally. The ventricles are proportionate to the degree of atrophy. No significant extra-axial fluid collection is present. Bilateral lens replacements are noted. The globes and orbits are otherwise intact. No significant extracranial soft tissue injury is evident. The calvarium is intact. Mild mucosal thickening is present in the maxillary sinuses bilaterally. There the is a small fluid level in the sphenoid sinuses. Minimal mucosal thickening is present in the left frontal sinus. The mastoid air cells are clear. Atherosclerotic calcifications are present within the  cavernous internal carotid arteries bilaterally. CT CERVICAL SPINE FINDINGS The cervical spine is imaged from the skullbase through T2-3. Vertebral body heights and alignment are maintained. There is chronic loss of disc height and endplate sclerotic change at C5-6. Minimal anterolisthesis is present at C6-7 with chronic loss of disc height. Asymmetric left-sided facet hypertrophy is most evident at C3-4 and C4-5. Osseous foraminal narrowing is worse on the right at C5-6. Soft tissues the neck demonstrate moderate atherosclerotic calcifications at the carotid bifurcations bilaterally. The lung apices are clear. Additional vascular calcifications are present at the thoracic inlet. No acute fracture or traumatic subluxation is present. IMPRESSION: 1. Moderate generalized atrophy and advanced diffuse white matter disease bilaterally likely reflects the sequela of chronic microvascular ischemia. 2. No acute intracranial abnormality. 3. No evidence for acute trauma. 4. Moderate spondylosis in the cervical spine without acute fracture or traumatic subluxation. 5. Atherosclerosis. Electronically Signed   By: San Morelle M.D.   On: 11/09/2015 08:53  Ct Cervical Spine Wo Contrast  11/09/2015  CLINICAL DATA:  On witnessed fall.  Chronic left eyelid droop. EXAM: CT HEAD WITHOUT CONTRAST CT CERVICAL SPINE WITHOUT CONTRAST TECHNIQUE: Multidetector CT imaging of the head and cervical spine was performed following the standard protocol without intravenous contrast. Multiplanar CT image reconstructions of the cervical spine were also generated. COMPARISON:  None. FINDINGS: CT HEAD FINDINGS Moderate generalized atrophy and advanced diffuse white matter disease is evident bilaterally. No acute cortical infarct, hemorrhage, or mass lesion is present. The basal ganglia are grossly intact. Insular ribbon is intact bilaterally. The ventricles are proportionate to the degree of atrophy. No significant extra-axial fluid  collection is present. Bilateral lens replacements are noted. The globes and orbits are otherwise intact. No significant extracranial soft tissue injury is evident. The calvarium is intact. Mild mucosal thickening is present in the maxillary sinuses bilaterally. There the is a small fluid level in the sphenoid sinuses. Minimal mucosal thickening is present in the left frontal sinus. The mastoid air cells are clear. Atherosclerotic calcifications are present within the cavernous internal carotid arteries bilaterally. CT CERVICAL SPINE FINDINGS The cervical spine is imaged from the skullbase through T2-3. Vertebral body heights and alignment are maintained. There is chronic loss of disc height and endplate sclerotic change at C5-6. Minimal anterolisthesis is present at C6-7 with chronic loss of disc height. Asymmetric left-sided facet hypertrophy is most evident at C3-4 and C4-5. Osseous foraminal narrowing is worse on the right at C5-6. Soft tissues the neck demonstrate moderate atherosclerotic calcifications at the carotid bifurcations bilaterally. The lung apices are clear. Additional vascular calcifications are present at the thoracic inlet. No acute fracture or traumatic subluxation is present. IMPRESSION: 1. Moderate generalized atrophy and advanced diffuse white matter disease bilaterally likely reflects the sequela of chronic microvascular ischemia. 2. No acute intracranial abnormality. 3. No evidence for acute trauma. 4. Moderate spondylosis in the cervical spine without acute fracture or traumatic subluxation. 5. Atherosclerosis. Electronically Signed   By: San Morelle M.D.   On: 11/09/2015 08:53   I have personally reviewed and evaluated these images and lab results as part of my medical decision-making.   EKG Interpretation None     Filed Vitals:   11/09/15 0801  BP: 144/78  Pulse: 65  Temp: 98 F (36.7 C)  TempSrc: Oral  Resp: 14  SpO2: 94%    MDM  Patient with history of  dementia, Resides at a memory care facility comes in for Evaluation of unwitnessed fall. On arrival, she is very pleasant and compliant with exam. She is hemodynamically stable and afebrile. She does have dementia and is unable to remember the circumstances Surrounding her fall. Physical exam is unremarkable. However, Plan toobtain CT head and neck. If negative, anticipate discharge to care facility. CT of head and neck are negative. Patient remains asymptomatic with no complaints, appears very well. Stable for discharge back to facility. Prior to discharge, I discussed and reviewed this case with my attending, Dr. Zenia Resides who also saw the patient and agrees with plan. Final diagnoses:  Fall, initial encounter        Comer Locket, PA-C 11/09/15 [place], PA-C 11/09/15 1423

## 2015-11-09 NOTE — ED Notes (Signed)
Patient presents to [facility] after suffering an unwitnessed fall this morning at [facility] where she resides. Patient denies pain, no obvious deformities. Patient exhibits left eye droop which according to staff is baseline for patient.

## 2016-06-02 ENCOUNTER — Emergency Department (HOSPITAL_COMMUNITY)
Admission: EM | Admit: 2016-06-02 | Discharge: 2016-06-02 | Disposition: A | Discharge status: Home / Self Care | Payer: Medicare Other | Attending: Emergency Medicine | Admitting: Emergency Medicine

## 2016-06-02 ENCOUNTER — Emergency Department (HOSPITAL_COMMUNITY): Payer: Medicare Other

## 2016-06-02 ENCOUNTER — Encounter (HOSPITAL_COMMUNITY): Payer: Self-pay

## 2016-06-02 DIAGNOSIS — Y929 Unspecified place or not applicable: Secondary | ICD-10-CM | POA: Insufficient documentation

## 2016-06-02 DIAGNOSIS — Y939 Activity, unspecified: Secondary | ICD-10-CM | POA: Insufficient documentation

## 2016-06-02 DIAGNOSIS — Y999 Unspecified external cause status: Secondary | ICD-10-CM | POA: Diagnosis not present

## 2016-06-02 DIAGNOSIS — W07XXXA Fall from chair, initial encounter: Secondary | ICD-10-CM | POA: Diagnosis not present

## 2016-06-02 DIAGNOSIS — W19XXXA Unspecified fall, initial encounter: Secondary | ICD-10-CM

## 2016-06-02 DIAGNOSIS — S0181XA Laceration without foreign body of other part of head, initial encounter: Secondary | ICD-10-CM | POA: Insufficient documentation

## 2016-06-02 DIAGNOSIS — I10 Essential (primary) hypertension: Secondary | ICD-10-CM | POA: Diagnosis not present

## 2016-06-02 DIAGNOSIS — G309 Alzheimer's disease, unspecified: Secondary | ICD-10-CM | POA: Diagnosis not present

## 2016-06-02 DIAGNOSIS — S0990XA Unspecified injury of head, initial encounter: Secondary | ICD-10-CM | POA: Diagnosis present

## 2016-06-02 DIAGNOSIS — Z79899 Other long term (current) drug therapy: Secondary | ICD-10-CM | POA: Diagnosis not present

## 2016-06-02 HISTORY — DX: Alzheimer's disease, unspecified: G30.9

## 2016-06-02 HISTORY — DX: Dementia in other diseases classified elsewhere, unspecified severity, without behavioral disturbance, psychotic disturbance, mood disturbance, and anxiety: F02.80

## 2016-06-02 MED ORDER — LIDOCAINE HCL 2 % IJ SOLN
20.0000 mL | Freq: Once | INTRAMUSCULAR | Status: AC
  Administered 2016-06-02: 400 mg
  Filled 2016-06-02: qty 20

## 2016-06-02 NOTE — ED Notes (Signed)
Patient ambulated to restroom with steady gait.

## 2016-06-02 NOTE — ED Notes (Signed)
Patient laceration cleaned and ready for stitches.

## 2016-06-02 NOTE — ED Provider Notes (Signed)
Medical screening examination/treatment/procedure(s) were performed by non-physician practitioner and as supervising physician I was immediately available for consultation/collaboration.   EKG Interpretation None     82 year old female here after witnessed fall at nursing home. Head and neck CT negative. Laceration noted to the forehead will be repaired.   Lacretia Leigh, MD 06/02/16 Karl Bales

## 2016-06-02 NOTE — ED Provider Notes (Signed)
[facility] DEPT Provider Note   CSN: [id number] Arrival date & time: 06/02/16  1744     History   Chief Complaint Chief Complaint  Patient presents with  . Fall  . Head Injury    HPI Adriana Gray is a 82 y.o. female.  HPI Patient presents to the emergency department with injuries following a fall.  The patient was out in the courtyard at her assisted living when she stumbled and fell forward striking the right side of her head and forehead.  The patient has dementia and is not able to tell me much better and was witnessed by the nursing home staff and they state that she did not lose consciousness.  Patient was not given any medications prior to arrival Past Medical History:  Diagnosis Date  . Adenocarcinoma ([place])   . Alzheimer's disease   . Cataract   . Lichen sclerosus   . Osteopenia   . Osteoporosis   . S/P [id number]     Patient Active Problem List   Diagnosis Date Noted  . Essential hypertension 02/11/2015  . Renal cell carcinoma ([place]) 02/11/2015  . Cognitive impairment 02/11/2015  . PAC (premature atrial contraction) 02/10/2015  . Vaginal atrophy 02/03/2012  . Osteoporosis 02/03/2012  . Osteopenia 02/03/2012  . S/P total hysterectomy and BSO (bilateral salpingo-oophorectomy) 02/03/2012  . Endometrial/uterine adenocarcinoma ([place]) 02/03/2012    Past Surgical History:  Procedure Laterality Date  . ABDOMINAL HYSTERECTOMY    . EYE SURGERY    . HIP SURGERY    . SMALL INTESTINE SURGERY      OB History    Gravida Para Term Preterm AB Living   2 2       2    SAB TAB Ectopic Multiple Live Births           2       Home Medications    Prior to Admission medications   Medication Sig Start Date End Date Taking? Authorizing Provider  acetaminophen (TYLENOL) 325 MG tablet Take 325 mg by mouth 2 (two) times daily as needed for moderate pain or fever.    Yes Historical Provider, MD  calamine lotion Apply 1 application topically 5 (five) times daily as needed  for itching.   Yes Historical Provider, MD  digoxin (LANOXIN) 0.125 MG tablet Take 1 tablet (125 mcg total) by mouth daily. 02/11/15  Yes Belva Crome, MD  diphenhydrAMINE (BENADRYL) 2 % cream Apply 1 application topically 3 (three) times daily as needed for itching.   Yes Historical Provider, MD  divalproex (DEPAKOTE SPRINKLE) 125 MG capsule Take 125 mg by mouth daily.  11/03/15  Yes Historical Provider, MD  divalproex (DEPAKOTE) 125 MG DR tablet Take 125 mg by mouth at bedtime.   Yes Historical Provider, MD  gentamicin (GARAMYCIN) 0.3 % ophthalmic ointment Place 1 application into the left eye at bedtime.   Yes Historical Provider, MD  HYDROcodone-acetaminophen (NORCO/VICODIN) 5-325 MG tablet Take 1 tablet by mouth every 4 (four) hours as needed for moderate pain.   Yes Historical Provider, MD  ibuprofen (ADVIL,MOTRIN) 600 MG tablet Take 600 mg by mouth every 6 (six) hours as needed for moderate pain.   Yes Historical Provider, MD  ketotifen (ZADITOR) 0.025 % ophthalmic solution Place 1 drop into the left eye 4 (four) times daily.   Yes Historical Provider, MD  LORazepam (ATIVAN) 0.5 MG tablet Take 0.25 mg by mouth daily as needed for anxiety.   Yes Historical Provider, MD  Melatonin 3 MG TABS  Take 3 mg by mouth at bedtime.    Yes Historical Provider, MD  metoprolol succinate (TOPROL-XL) 25 MG 24 hr tablet Take 12.5 mg by mouth daily.   Yes Historical Provider, MD  moxifloxacin (VIGAMOX) 0.5 % ophthalmic solution Place 1 drop into the left eye 4 (four) times daily.   Yes Historical Provider, MD  NUTRITIONAL SUPPLEMENT LIQD Take 1 Can by mouth 2 (two) times daily between meals.   Yes Historical Provider, MD  ofloxacin (OCUFLOX) 0.3 % ophthalmic solution Place 1 drop into both eyes 4 (four) times daily.   Yes Historical Provider, MD  ondansetron (ZOFRAN) 8 MG tablet Take 8 mg by mouth every 8 (eight) hours as needed for nausea or vomiting.   Yes Historical Provider, MD  PRESCRIPTION MEDICATION Place 1  drop into the left ear 4 (four) times daily. Amphotericin eye drops   Yes Historical Provider, MD    Family History Family History  Problem Relation Age of Onset  . Cancer Mother   . Cancer Father   . Cancer Brother     Social History Social History  Substance Use Topics  . Smoking status: Never Smoker  . Smokeless tobacco: Never Used  . Alcohol use No     Allergies   Review of patient's allergies indicates no known allergies.   Review of Systems Review of Systems Level V caveat applies due to dementia  Physical Exam Updated Vital Signs BP 141/60 (BP Location: Left Arm)   Pulse 78   Temp 98.7 F (37.1 C) (Oral)   Resp 18   SpO2 97%   Physical Exam  Constitutional: She appears well-developed and well-nourished. No distress.  HENT:  Head: Normocephalic.    Mouth/Throat: Oropharynx is clear and moist.  Eyes: Pupils are equal, round, and reactive to light.  Neck: Normal range of motion. Neck supple.  Cardiovascular: Normal rate, regular rhythm and normal heart sounds.  Exam reveals no gallop and no friction rub.   No murmur heard. Pulmonary/Chest: Effort normal and breath sounds normal. No respiratory distress. She has no wheezes.  Abdominal: Soft. Bowel sounds are normal. She exhibits no distension. There is no tenderness.  Neurological: She is alert. She exhibits normal muscle tone. Coordination normal.  Skin: Skin is warm and dry. No rash noted. No erythema.  Psychiatric: She has a normal mood and affect. Her behavior is normal.  Nursing note and vitals reviewed.    ED Treatments / Results  Labs (all labs ordered are listed, but only abnormal results are displayed) Labs Reviewed - No data to display  EKG  EKG Interpretation None       Radiology Ct Head Wo Contrast  Result Date: 06/02/2016 CLINICAL DATA:  82 year old female with fall. EXAM: CT HEAD WITHOUT CONTRAST CT CERVICAL SPINE WITHOUT CONTRAST TECHNIQUE: Multidetector CT imaging of the head  and cervical spine was performed following the standard protocol without intravenous contrast. Multiplanar CT image reconstructions of the cervical spine were also generated. COMPARISON:  CT dated 11/09/2015 FINDINGS: CT HEAD FINDINGS Brain: There is moderate age-related atrophy and chronic microvascular ischemic changes. There is no acute intracranial hemorrhage. No mass effect or midline shift noted. No extra-axial fluid collection. Vascular: No hyperdense vessel or unexpected calcification. Skull: Normal. Negative for fracture or focal lesion. Sinuses/Orbits: There is partial opacification of the right maxillary sinus and sphenoid sinuses. The remainder of the paranasal sinuses and mastoid air cells are clear. Other: Small right forehead scalp hematoma. CT CERVICAL SPINE FINDINGS Alignment: Normal. Skull base  and vertebrae: No acute fracture. No primary bone lesion or focal pathologic process. Soft tissues and spinal canal: No prevertebral fluid or swelling. No visible canal hematoma. Bilateral carotid bulb atherosclerotic plaques, left greater right. Disc levels: There multilevel degenerative changes most prominent at C5-C6 where there is disc space narrowing and endplate irregularity. Upper chest: Negative. Other: None IMPRESSION: No acute intracranial hemorrhage. No acute/traumatic cervical spine pathology. Electronically Signed   By: Anner Crete M.D.   On: 06/02/2016 19:24   Ct Cervical Spine Wo Contrast  Result Date: 06/02/2016 CLINICAL DATA:  82 year old female with fall. EXAM: CT HEAD WITHOUT CONTRAST CT CERVICAL SPINE WITHOUT CONTRAST TECHNIQUE: Multidetector CT imaging of the head and cervical spine was performed following the standard protocol without intravenous contrast. Multiplanar CT image reconstructions of the cervical spine were also generated. COMPARISON:  CT dated 11/09/2015 FINDINGS: CT HEAD FINDINGS Brain: There is moderate age-related atrophy and chronic microvascular ischemic  changes. There is no acute intracranial hemorrhage. No mass effect or midline shift noted. No extra-axial fluid collection. Vascular: No hyperdense vessel or unexpected calcification. Skull: Normal. Negative for fracture or focal lesion. Sinuses/Orbits: There is partial opacification of the right maxillary sinus and sphenoid sinuses. The remainder of the paranasal sinuses and mastoid air cells are clear. Other: Small right forehead scalp hematoma. CT CERVICAL SPINE FINDINGS Alignment: Normal. Skull base and vertebrae: No acute fracture. No primary bone lesion or focal pathologic process. Soft tissues and spinal canal: No prevertebral fluid or swelling. No visible canal hematoma. Bilateral carotid bulb atherosclerotic plaques, left greater right. Disc levels: There multilevel degenerative changes most prominent at C5-C6 where there is disc space narrowing and endplate irregularity. Upper chest: Negative. Other: None IMPRESSION: No acute intracranial hemorrhage. No acute/traumatic cervical spine pathology. Electronically Signed   By: Anner Crete M.D.   On: 06/02/2016 19:24    Procedures Procedures (including critical care time)  Medications Ordered in ED Medications  lidocaine (XYLOCAINE) 2 % (with pres) injection 400 mg (400 mg Infiltration Given 06/02/16 2017)     Initial Impression / Assessment and Plan / ED Course  I have reviewed the triage vital signs and the nursing notes.  Pertinent labs & imaging results that were available during my care of the patient were reviewed by me and considered in my medical decision making (see chart for details).  Clinical Course    Patient has negative CT scan of the head and neck.  The patients offers given the results and all questions were answered.  I repaired the 2.5 cm laceration on the upper forehead by the hairline with Dermabond because the patient would not tolerate suturing.  She was moving and thrashing, but was able to tolerate the  Dermabond Final Clinical Impressions(s) / ED Diagnoses   Final diagnoses:  None    New Prescriptions New Prescriptions   No medications on file     Dalia Heading, PA-C 06/03/16 0143

## 2016-06-02 NOTE — ED Notes (Signed)
Patient transported to CT 

## 2016-06-02 NOTE — Discharge Instructions (Signed)
Return here as needed.  Follow-up with your primary care doctor °

## 2016-06-02 NOTE — ED Triage Notes (Signed)
Per EMS, Pt, from [facility] on [address], presents w/ a head laceration and abrasion r/t falling out of a chair earlier.  Pt reports that the chair "flipped over."  Per facility, Pt is at neuro baseline.  No blood thinners.

## 2017-05-07 IMAGING — CT CT HEAD W/O CM
4 of 8 series · 16 of 47 positions shown, 18 images · non-contrast
Comparison: CT dated 11/09/2015

CLINICAL DATA: 88-year-old female with fall.

EXAM:
CT HEAD WITHOUT CONTRAST
CT CERVICAL SPINE WITHOUT CONTRAST
TECHNIQUE: Multidetector CT imaging of the head and cervical spine was
performed following the standard protocol without intravenous
contrast. Multiplanar CT image reconstructions of the cervical spine
were also generated.

[Series 6: axial recon · axial · 0.23mm/px · z∈[+1336,+1462]mm · 7 of 88 slices shown, 9 images]
[im 11/88  brain]
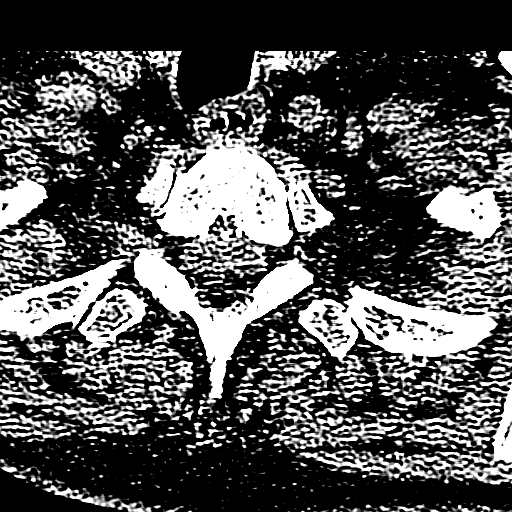
[im 11/88  bone]
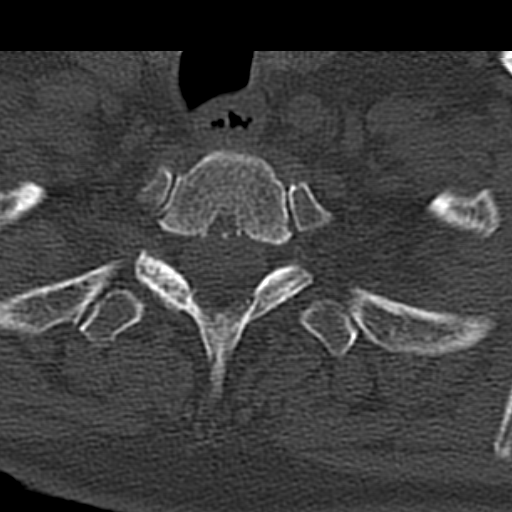
[im 22/88  brain]
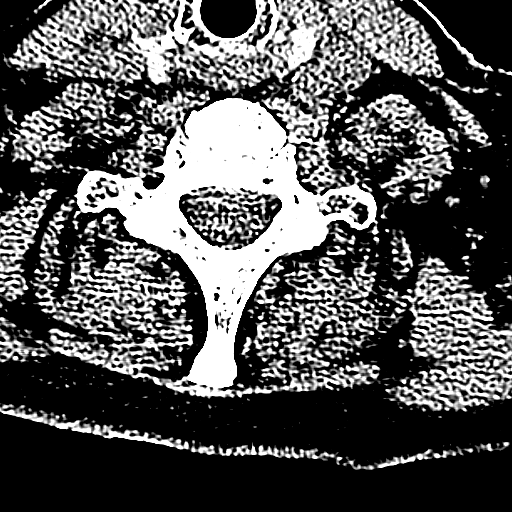
[im 33/88  brain]
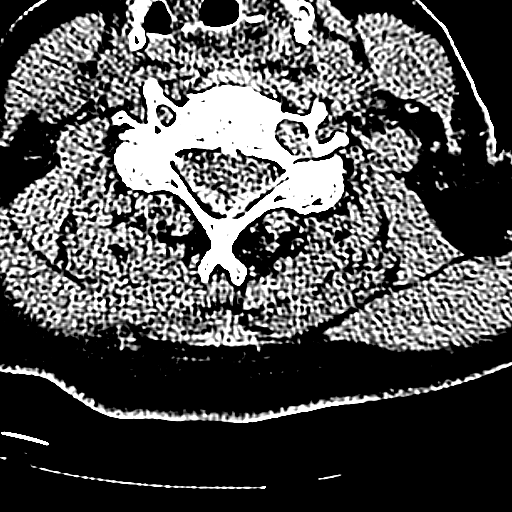
[im 44/88  brain]
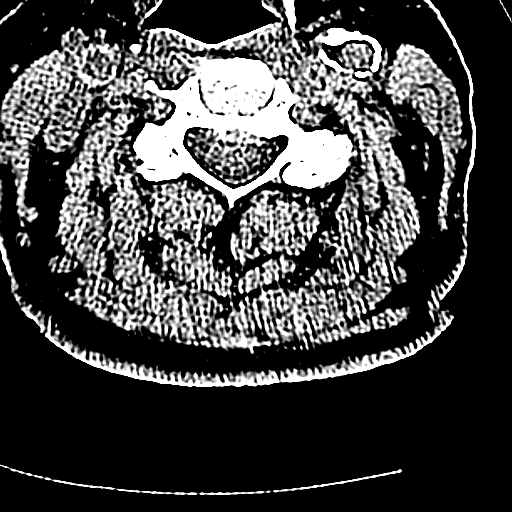
[im 55/88  brain]
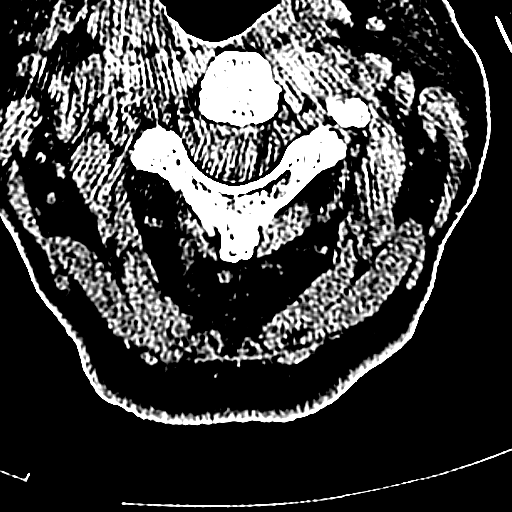
[im 55/88  bone]
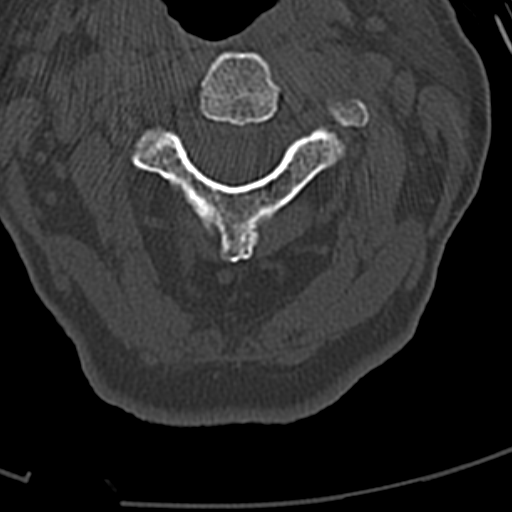
[im 66/88  brain]
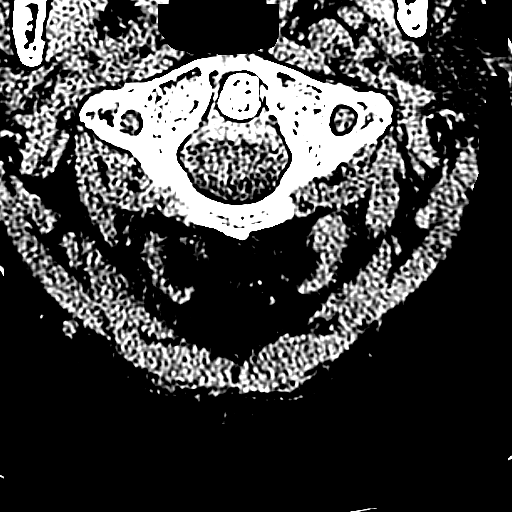
[im 77/88  brain]
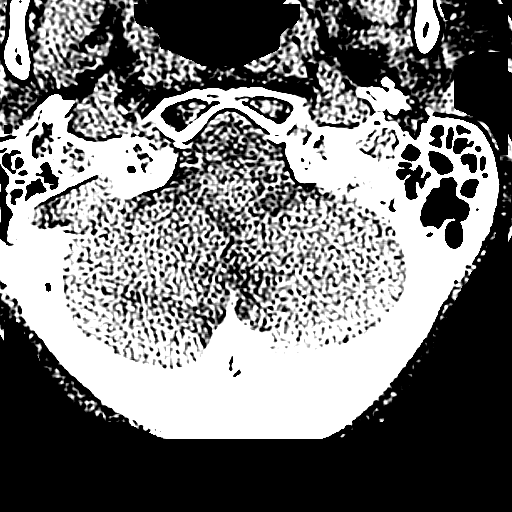

[Series 8: sagittal · sagittal · 0.29mm/px · 2 of 61 slices shown]
[im 21/61  brain]
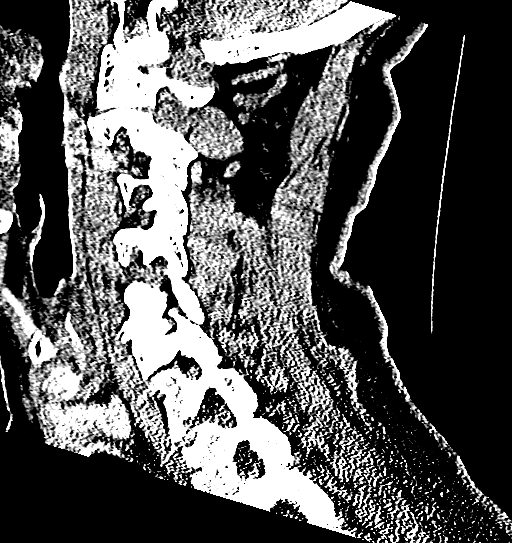
[im 41/61  brain]
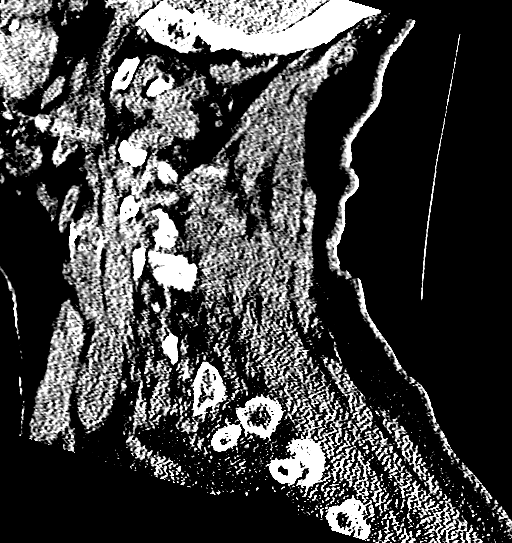

[Series 10: bone windows · axial · 0.43mm/px · z∈[+1493,+1542]mm · 4 of 99 slices shown]
[im 11/99  bone]
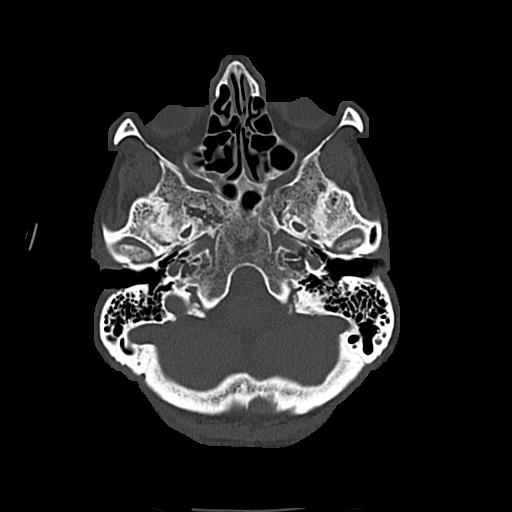
[im 22/99  bone]
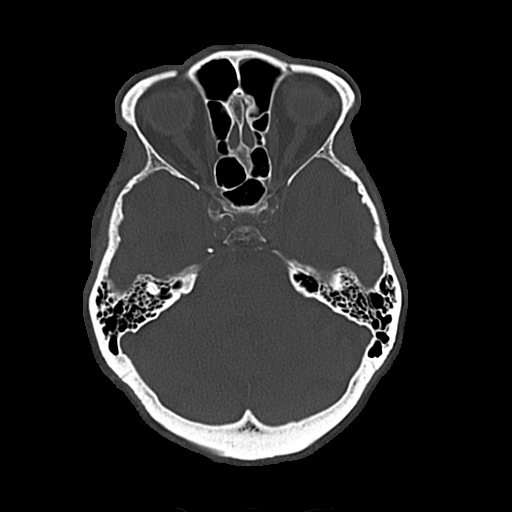
[im 33/99  bone]
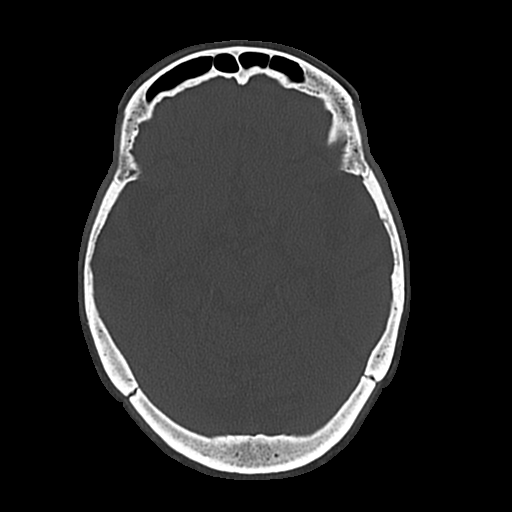
[im 44/99  bone]
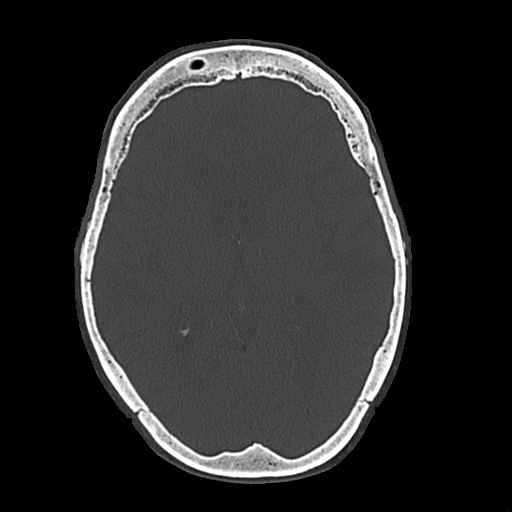

[Series 11: coronal · coronal · 0.29mm/px · 3 of 73 slices shown]
[im 21/73  brain]
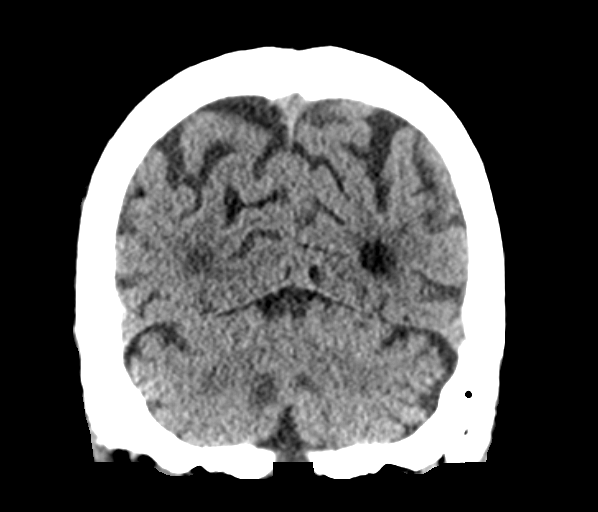
[im 31/73  brain]
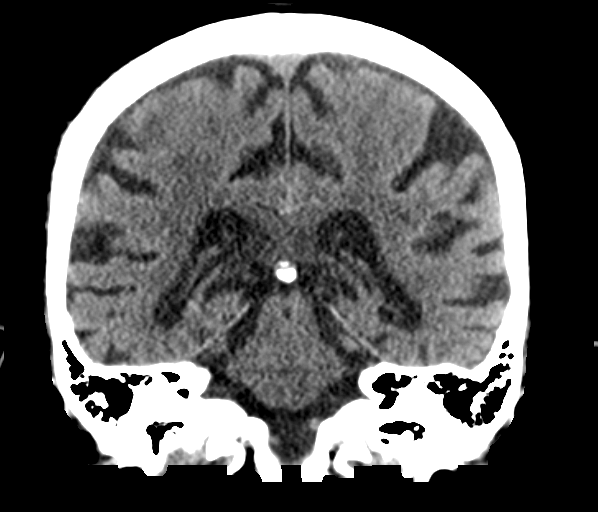
[im 42/73  brain]
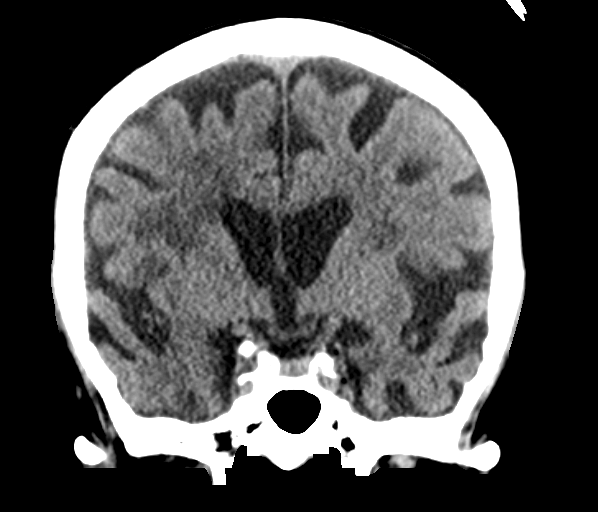

[16 of 47 positions shown; findings below may reference images not displayed]

FINDINGS: CT HEAD FINDINGS

Brain: There is moderate age-related atrophy and chronic
microvascular ischemic changes. There is no acute intracranial
hemorrhage. No mass effect or midline shift noted. No extra-axial
fluid collection.

Vascular: No hyperdense vessel or unexpected calcification.

Skull: Normal. Negative for fracture or focal lesion.

Sinuses/Orbits: There is partial opacification of the right
maxillary sinus and sphenoid sinuses. The remainder of the paranasal
sinuses and mastoid air cells are clear.

Other: Small right forehead scalp hematoma.

CT CERVICAL SPINE FINDINGS

Alignment: Normal.

Skull base and vertebrae: No acute fracture. No primary bone lesion
or focal pathologic process.

Soft tissues and spinal canal: No prevertebral fluid or swelling. No
visible canal hematoma. Bilateral carotid bulb atherosclerotic
plaques, left greater right.

Disc levels: There multilevel degenerative changes most prominent at
C5-C6 where there is disc space narrowing and endplate irregularity.

Upper chest: Negative.

Other: None
IMPRESSION: No acute intracranial hemorrhage.

No acute/traumatic cervical spine pathology.

## 2019-01-30 DEATH — deceased
